# Patient Record
Sex: Female | Born: 2005 | Race: White | Hispanic: No | Marital: Single | Smoking: Former smoker
Health system: Southern US, Community
[De-identification: ages and names within clinical notes are randomized; demographics above are authoritative.]

## PROBLEM LIST (undated history)

## (undated) DIAGNOSIS — N390 Urinary tract infection, site not specified: Secondary | ICD-10-CM

## (undated) DIAGNOSIS — H6993 Unspecified Eustachian tube disorder, bilateral: Secondary | ICD-10-CM

## (undated) DIAGNOSIS — F321 Major depressive disorder, single episode, moderate: Secondary | ICD-10-CM

## (undated) DIAGNOSIS — F419 Anxiety disorder, unspecified: Secondary | ICD-10-CM

## (undated) DIAGNOSIS — R010 Benign and innocent cardiac murmurs: Secondary | ICD-10-CM

## (undated) DIAGNOSIS — F329 Major depressive disorder, single episode, unspecified: Secondary | ICD-10-CM

## (undated) DIAGNOSIS — J45909 Unspecified asthma, uncomplicated: Secondary | ICD-10-CM

## (undated) DIAGNOSIS — Z8619 Personal history of other infectious and parasitic diseases: Secondary | ICD-10-CM

## (undated) DIAGNOSIS — G479 Sleep disorder, unspecified: Secondary | ICD-10-CM

## (undated) DIAGNOSIS — H6983 Other specified disorders of Eustachian tube, bilateral: Secondary | ICD-10-CM

## (undated) DIAGNOSIS — T50902A Poisoning by unspecified drugs, medicaments and biological substances, intentional self-harm, initial encounter: Secondary | ICD-10-CM

## (undated) DIAGNOSIS — M419 Scoliosis, unspecified: Secondary | ICD-10-CM

## (undated) DIAGNOSIS — J309 Allergic rhinitis, unspecified: Secondary | ICD-10-CM

## (undated) HISTORY — DX: Major depressive disorder, single episode, moderate: F32.1

## (undated) HISTORY — DX: Poisoning by unspecified drugs, medicaments and biological substances, intentional self-harm, initial encounter: T50.902A

## (undated) HISTORY — DX: Unspecified asthma, uncomplicated: J45.909

## (undated) HISTORY — DX: Major depressive disorder, single episode, unspecified: F32.9

## (undated) HISTORY — PX: NO PAST SURGERIES: SHX2092

## (undated) HISTORY — DX: Unspecified eustachian tube disorder, bilateral: H69.93

## (undated) HISTORY — DX: Sleep disorder, unspecified: G47.9

## (undated) HISTORY — DX: Scoliosis, unspecified: M41.9

## (undated) HISTORY — DX: Allergic rhinitis, unspecified: J30.9

## (undated) HISTORY — DX: Personal history of other infectious and parasitic diseases: Z86.19

## (undated) HISTORY — DX: Anxiety disorder, unspecified: F41.9

## (undated) HISTORY — DX: Benign and innocent cardiac murmurs: R01.0

## (undated) HISTORY — DX: Other specified disorders of eustachian tube, bilateral: H69.83

---
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
## 2024-01-17 DIAGNOSIS — Z3403 Encounter for supervision of normal first pregnancy, third trimester: Secondary | ICD-10-CM | POA: Diagnosis not present

## 2024-01-17 DIAGNOSIS — Z3689 Encounter for other specified antenatal screening: Secondary | ICD-10-CM | POA: Diagnosis not present

## 2024-01-17 DIAGNOSIS — Z23 Encounter for immunization: Secondary | ICD-10-CM | POA: Diagnosis not present

## 2024-02-14 DIAGNOSIS — Z362 Encounter for other antenatal screening follow-up: Secondary | ICD-10-CM | POA: Diagnosis not present

## 2024-02-14 DIAGNOSIS — Z364 Encounter for antenatal screening for fetal growth retardation: Secondary | ICD-10-CM | POA: Diagnosis not present

## 2024-02-14 DIAGNOSIS — Z3A31 31 weeks gestation of pregnancy: Secondary | ICD-10-CM | POA: Diagnosis not present

## 2024-03-10 DIAGNOSIS — D62 Acute posthemorrhagic anemia: Secondary | ICD-10-CM | POA: Diagnosis not present

## 2024-03-10 DIAGNOSIS — Z87891 Personal history of nicotine dependence: Secondary | ICD-10-CM | POA: Diagnosis not present

## 2024-03-10 DIAGNOSIS — D509 Iron deficiency anemia, unspecified: Secondary | ICD-10-CM | POA: Diagnosis not present

## 2024-03-10 DIAGNOSIS — Z3A35 35 weeks gestation of pregnancy: Secondary | ICD-10-CM | POA: Diagnosis not present

## 2024-03-10 DIAGNOSIS — O9902 Anemia complicating childbirth: Secondary | ICD-10-CM | POA: Diagnosis not present

## 2024-04-25 DIAGNOSIS — F4320 Adjustment disorder, unspecified: Secondary | ICD-10-CM | POA: Diagnosis not present

## 2024-04-30 DIAGNOSIS — F4320 Adjustment disorder, unspecified: Secondary | ICD-10-CM | POA: Diagnosis not present

## 2024-05-07 DIAGNOSIS — F4320 Adjustment disorder, unspecified: Secondary | ICD-10-CM | POA: Diagnosis not present

## 2024-05-14 DIAGNOSIS — F4320 Adjustment disorder, unspecified: Secondary | ICD-10-CM | POA: Diagnosis not present

## 2024-05-21 DIAGNOSIS — F4320 Adjustment disorder, unspecified: Secondary | ICD-10-CM | POA: Diagnosis not present

## 2024-05-28 DIAGNOSIS — F4320 Adjustment disorder, unspecified: Secondary | ICD-10-CM | POA: Diagnosis not present

## 2024-06-04 DIAGNOSIS — F4320 Adjustment disorder, unspecified: Secondary | ICD-10-CM | POA: Diagnosis not present

## 2024-06-11 DIAGNOSIS — F4320 Adjustment disorder, unspecified: Secondary | ICD-10-CM | POA: Diagnosis not present

## 2024-07-02 DIAGNOSIS — F4320 Adjustment disorder, unspecified: Secondary | ICD-10-CM | POA: Diagnosis not present

## 2024-07-16 DIAGNOSIS — F4320 Adjustment disorder, unspecified: Secondary | ICD-10-CM | POA: Diagnosis not present

## 2024-07-23 DIAGNOSIS — F4320 Adjustment disorder, unspecified: Secondary | ICD-10-CM | POA: Diagnosis not present

## 2024-08-04 DIAGNOSIS — Z87891 Personal history of nicotine dependence: Secondary | ICD-10-CM | POA: Diagnosis not present

## 2024-08-04 DIAGNOSIS — Z3A08 8 weeks gestation of pregnancy: Secondary | ICD-10-CM | POA: Diagnosis not present

## 2024-08-04 DIAGNOSIS — Z3201 Encounter for pregnancy test, result positive: Secondary | ICD-10-CM | POA: Diagnosis not present

## 2024-08-09 DIAGNOSIS — U071 COVID-19: Secondary | ICD-10-CM | POA: Diagnosis not present

## 2024-08-26 DIAGNOSIS — Z23 Encounter for immunization: Secondary | ICD-10-CM | POA: Diagnosis not present

## 2024-08-26 DIAGNOSIS — O3680X0 Pregnancy with inconclusive fetal viability, not applicable or unspecified: Secondary | ICD-10-CM | POA: Diagnosis not present

## 2024-08-26 DIAGNOSIS — Z3A08 8 weeks gestation of pregnancy: Secondary | ICD-10-CM | POA: Diagnosis not present

## 2024-10-22 DIAGNOSIS — Z23 Encounter for immunization: Secondary | ICD-10-CM | POA: Diagnosis not present

## 2024-11-11 DIAGNOSIS — Z3689 Encounter for other specified antenatal screening: Secondary | ICD-10-CM | POA: Diagnosis not present

## 2024-11-11 DIAGNOSIS — Z3A19 19 weeks gestation of pregnancy: Secondary | ICD-10-CM | POA: Diagnosis not present

## 2024-12-09 DIAGNOSIS — Z362 Encounter for other antenatal screening follow-up: Secondary | ICD-10-CM | POA: Diagnosis not present

## 2024-12-09 DIAGNOSIS — Z3A23 23 weeks gestation of pregnancy: Secondary | ICD-10-CM | POA: Diagnosis not present
